# Patient Record
Sex: Female | Born: 1965 | Race: White | Hispanic: No | Marital: Married | Smoking: Never smoker
Health system: Southern US, Community
[De-identification: ages and names within clinical notes are randomized; demographics above are authoritative.]

## PROBLEM LIST (undated history)

## (undated) DIAGNOSIS — F419 Anxiety disorder, unspecified: Secondary | ICD-10-CM

## (undated) HISTORY — DX: Anxiety disorder, unspecified: F41.9

---

## 1999-10-29 ENCOUNTER — Encounter: Admission: RE | Admit: 1999-10-29 | Discharge: 1999-10-29 | Payer: Self-pay | Admitting: Family Medicine

## 1999-10-29 ENCOUNTER — Encounter: Payer: Self-pay | Admitting: Family Medicine

## 2000-12-16 ENCOUNTER — Encounter (INDEPENDENT_AMBULATORY_CARE_PROVIDER_SITE_OTHER): Payer: Self-pay

## 2000-12-16 ENCOUNTER — Inpatient Hospital Stay (HOSPITAL_COMMUNITY): Admission: AD | Admit: 2000-12-16 | Discharge: 2000-12-19 | Payer: Self-pay | Admitting: Obstetrics and Gynecology

## 2001-02-01 ENCOUNTER — Other Ambulatory Visit: Admission: RE | Admit: 2001-02-01 | Discharge: 2001-02-01 | Payer: Self-pay | Admitting: *Deleted

## 2002-04-25 ENCOUNTER — Inpatient Hospital Stay (HOSPITAL_COMMUNITY): Admission: AD | Admit: 2002-04-25 | Discharge: 2002-04-25 | Payer: Self-pay | Admitting: *Deleted

## 2002-04-25 ENCOUNTER — Inpatient Hospital Stay (HOSPITAL_COMMUNITY): Admission: AD | Admit: 2002-04-25 | Discharge: 2002-04-28 | Payer: Self-pay | Admitting: Obstetrics and Gynecology

## 2003-09-18 ENCOUNTER — Other Ambulatory Visit: Admission: RE | Admit: 2003-09-18 | Discharge: 2003-09-18 | Payer: Self-pay | Admitting: *Deleted

## 2005-01-21 ENCOUNTER — Other Ambulatory Visit: Admission: RE | Admit: 2005-01-21 | Discharge: 2005-01-21 | Payer: Self-pay | Admitting: Obstetrics and Gynecology

## 2006-03-25 ENCOUNTER — Other Ambulatory Visit: Admission: RE | Admit: 2006-03-25 | Discharge: 2006-03-25 | Payer: Self-pay | Admitting: Family Medicine

## 2007-07-27 ENCOUNTER — Encounter: Admission: RE | Admit: 2007-07-27 | Discharge: 2007-07-27 | Payer: Self-pay | Admitting: Family Medicine

## 2008-05-07 ENCOUNTER — Other Ambulatory Visit: Admission: RE | Admit: 2008-05-07 | Discharge: 2008-05-07 | Payer: Self-pay | Admitting: Family Medicine

## 2013-03-02 ENCOUNTER — Other Ambulatory Visit (HOSPITAL_COMMUNITY)
Admission: RE | Admit: 2013-03-02 | Discharge: 2013-03-02 | Disposition: A | Discharge status: Home / Self Care | Payer: Self-pay | Source: Ambulatory Visit | Attending: Family Medicine | Admitting: Family Medicine

## 2013-03-02 DIAGNOSIS — Z01419 Encounter for gynecological examination (general) (routine) without abnormal findings: Secondary | ICD-10-CM | POA: Insufficient documentation

## 2014-07-30 ENCOUNTER — Other Ambulatory Visit: Payer: Self-pay

## 2014-07-30 DIAGNOSIS — Z1239 Encounter for other screening for malignant neoplasm of breast: Secondary | ICD-10-CM

## 2014-08-05 ENCOUNTER — Other Ambulatory Visit: Payer: Self-pay | Admitting: Family Medicine

## 2014-08-05 DIAGNOSIS — R140 Abdominal distension (gaseous): Secondary | ICD-10-CM

## 2014-08-08 ENCOUNTER — Other Ambulatory Visit: Payer: Self-pay

## 2014-08-12 ENCOUNTER — Ambulatory Visit
Admission: RE | Admit: 2014-08-12 | Discharge: 2014-08-12 | Disposition: A | Discharge status: Home / Self Care | Payer: BC Managed Care – PPO | Source: Ambulatory Visit | Attending: Family Medicine | Admitting: Family Medicine

## 2014-08-12 DIAGNOSIS — R140 Abdominal distension (gaseous): Secondary | ICD-10-CM

## 2014-08-15 ENCOUNTER — Other Ambulatory Visit: Payer: Self-pay

## 2014-08-15 DIAGNOSIS — Z1231 Encounter for screening mammogram for malignant neoplasm of breast: Secondary | ICD-10-CM

## 2014-08-16 ENCOUNTER — Other Ambulatory Visit: Payer: Self-pay | Admitting: Family Medicine

## 2014-08-16 DIAGNOSIS — N83202 Unspecified ovarian cyst, left side: Principal | ICD-10-CM

## 2014-08-16 DIAGNOSIS — N83201 Unspecified ovarian cyst, right side: Secondary | ICD-10-CM

## 2014-08-20 ENCOUNTER — Ambulatory Visit
Admission: RE | Admit: 2014-08-20 | Discharge: 2014-08-20 | Disposition: A | Discharge status: Home / Self Care | Payer: BC Managed Care – PPO | Source: Ambulatory Visit

## 2014-08-20 DIAGNOSIS — Z1231 Encounter for screening mammogram for malignant neoplasm of breast: Secondary | ICD-10-CM

## 2014-09-30 ENCOUNTER — Ambulatory Visit
Admission: RE | Admit: 2014-09-30 | Discharge: 2014-09-30 | Disposition: A | Discharge status: Home / Self Care | Payer: BC Managed Care – PPO | Source: Ambulatory Visit | Attending: Family Medicine | Admitting: Family Medicine

## 2014-09-30 DIAGNOSIS — N83202 Unspecified ovarian cyst, left side: Principal | ICD-10-CM

## 2014-09-30 DIAGNOSIS — N83201 Unspecified ovarian cyst, right side: Secondary | ICD-10-CM

## 2014-12-12 IMAGING — MG MM SCREEN MAMMOGRAM BILATERAL
4 series · 4 of 4 positions shown · non-contrast
Comparison: Previous exam(s).

CLINICAL DATA: Screening.

EXAM:
DIGITAL SCREENING BILATERAL MAMMOGRAM WITH CAD

[R CC]
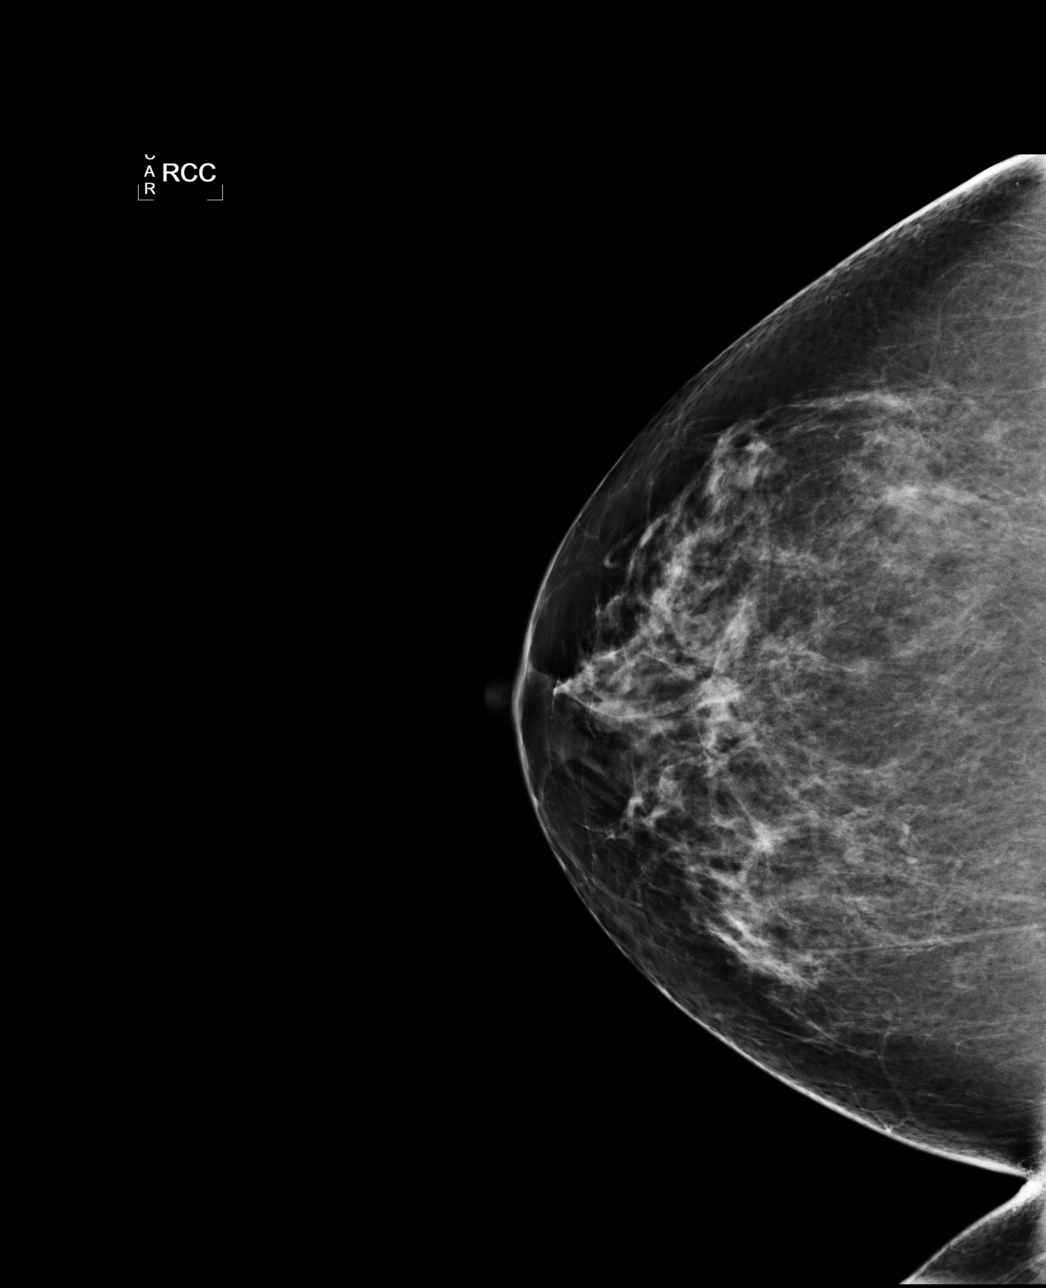

[L CC]
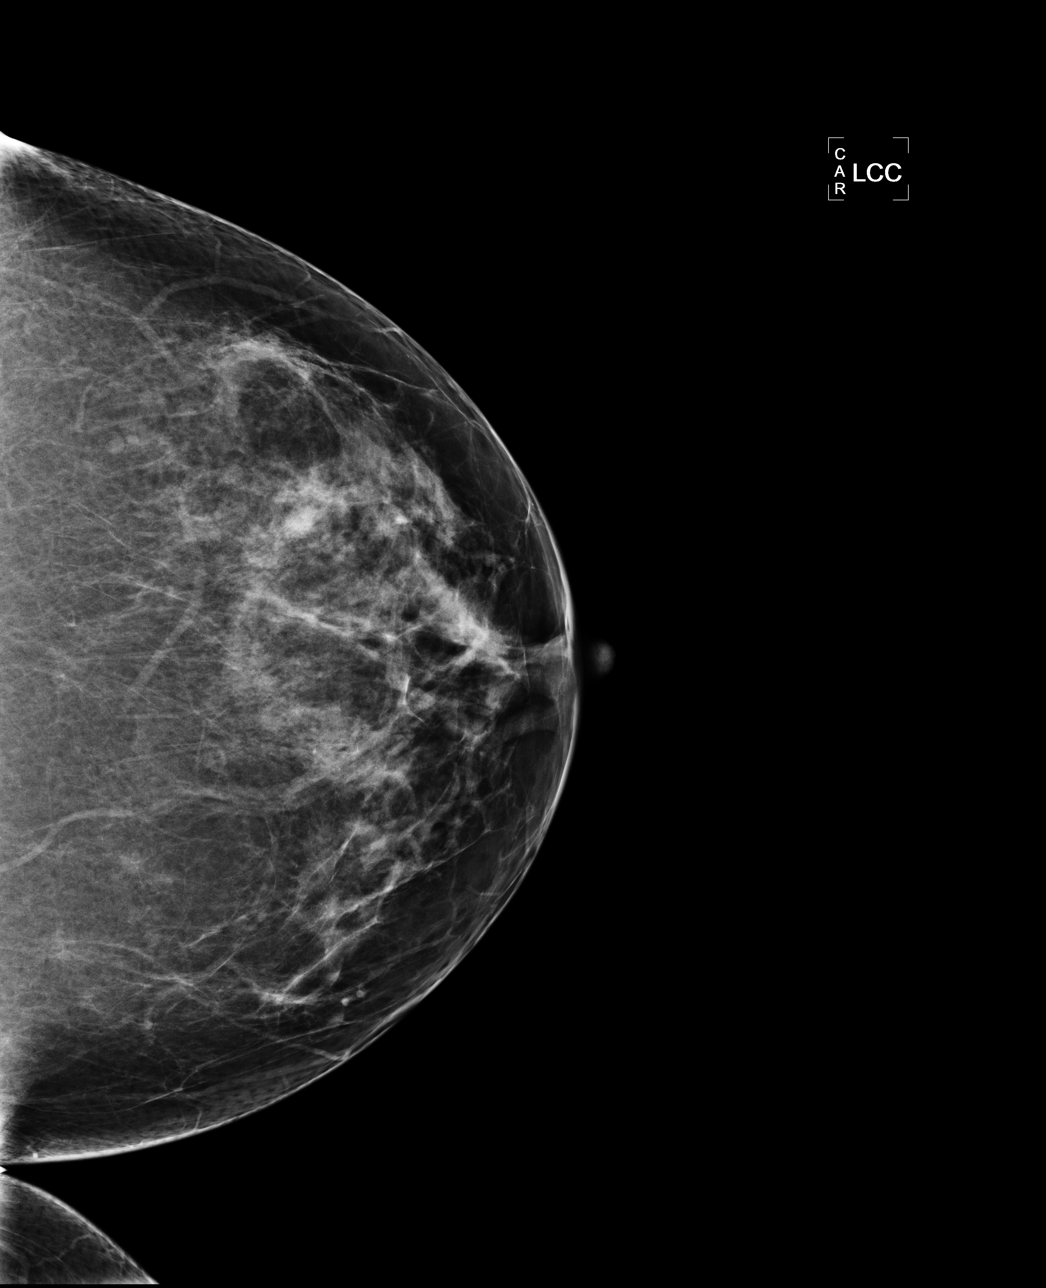

[L MLO]
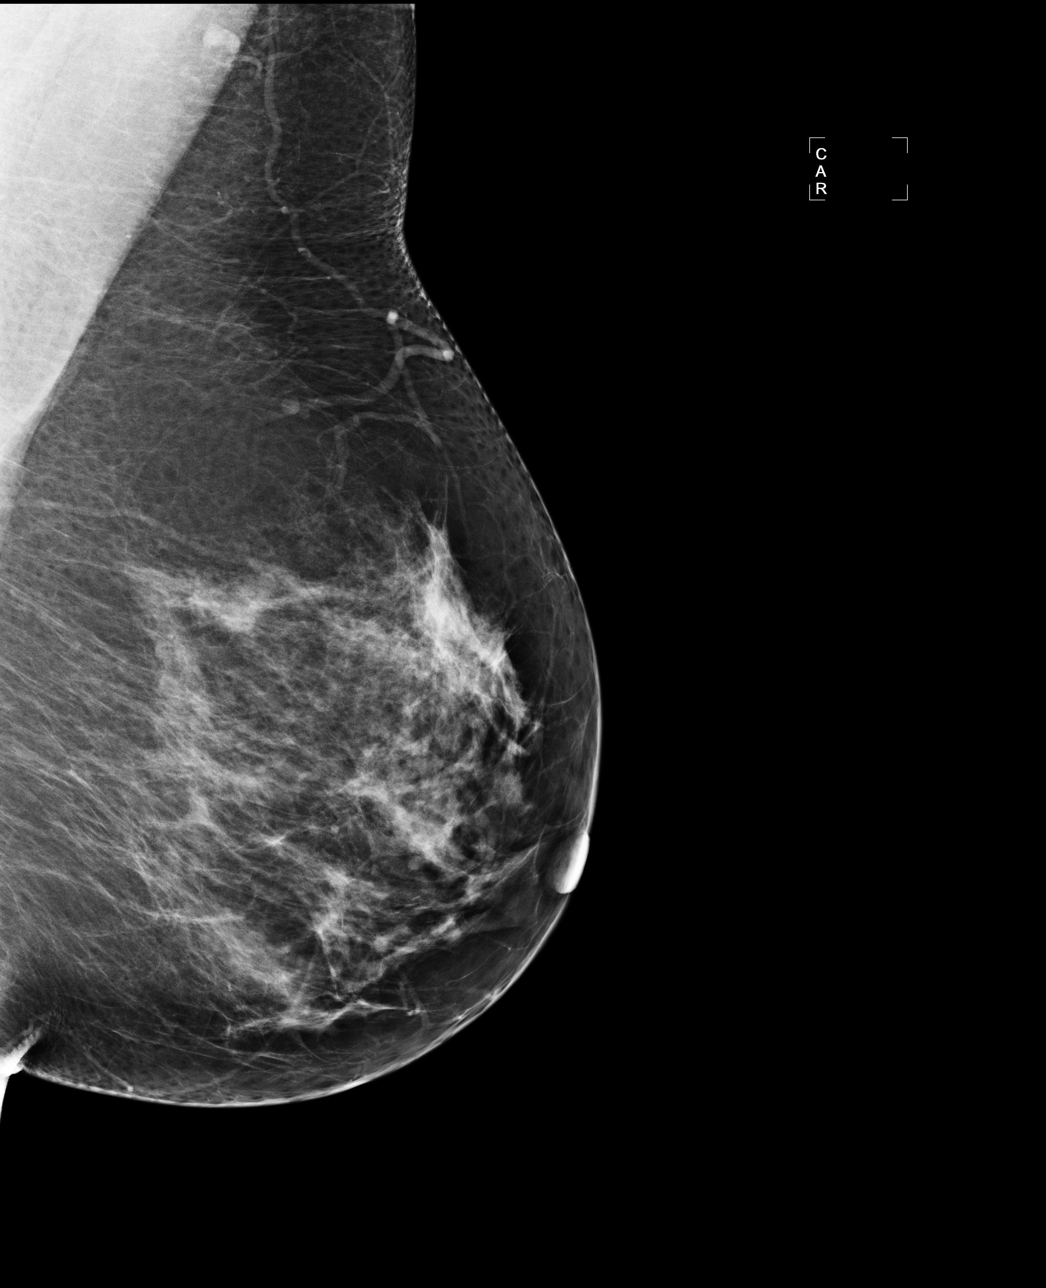

[R MLO]
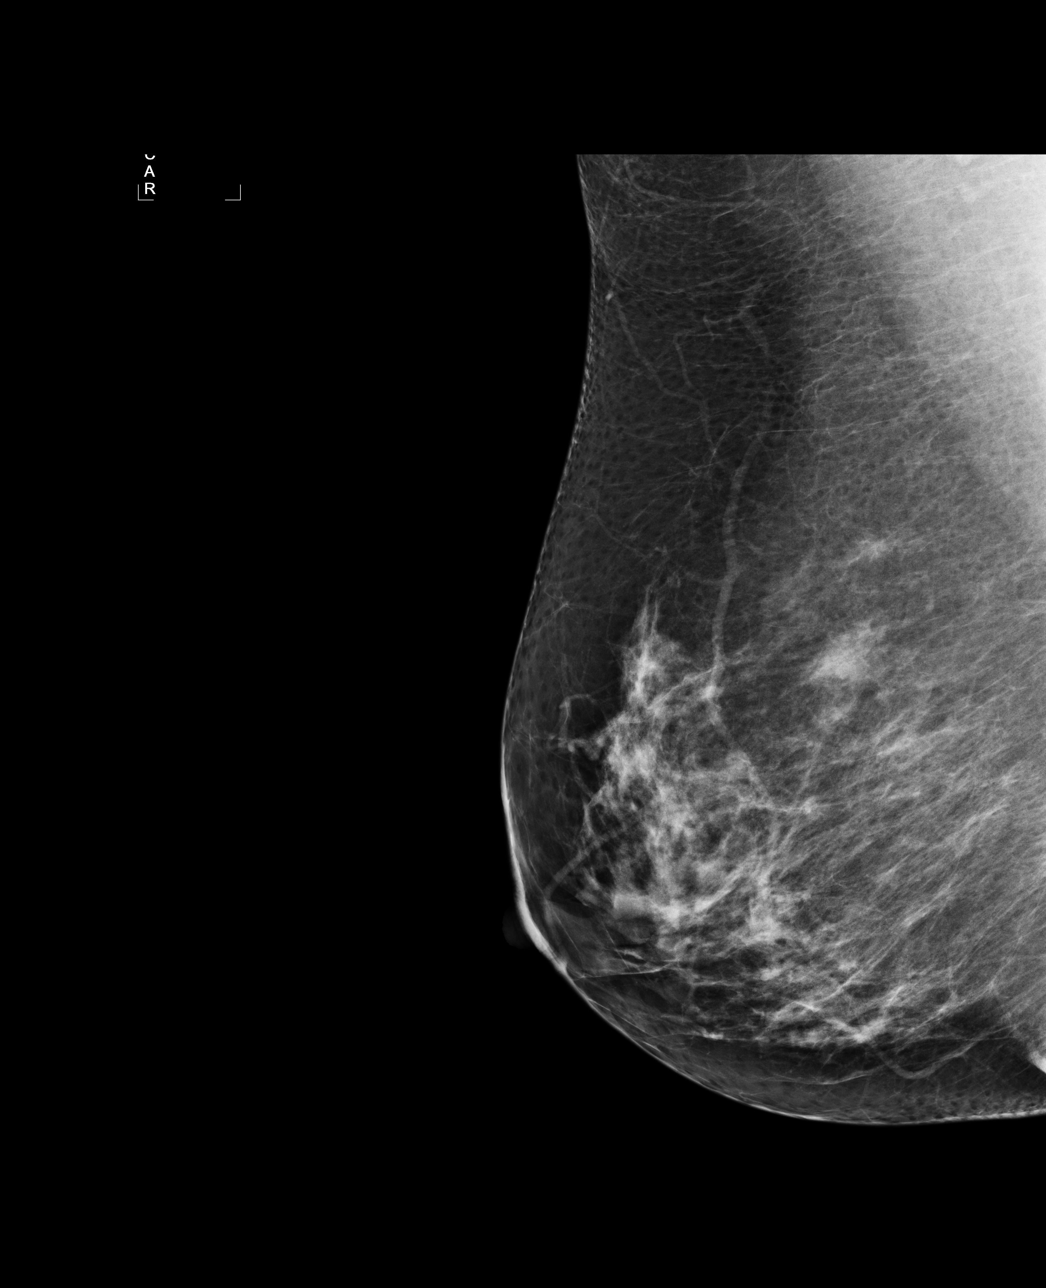

[4 of 4 positions shown; findings below may reference images not displayed]

ACR Breast Density Category c: The breast tissue is heterogeneously
dense, which may obscure small masses.
FINDINGS: There are no findings suspicious for malignancy. Images were
processed with CAD.
IMPRESSION: No mammographic evidence of malignancy. A result letter of this
screening mammogram will be mailed directly to the patient.

RECOMMENDATION:
Screening mammogram in one year. (Code:YJ-[id number])

BI-RADS CATEGORY  1: Negative.

## 2015-01-22 IMAGING — US US PELVIS COMPLETE
1 series · 13 of 25 positions shown · non-contrast
Comparison: 08/12/2014.

CLINICAL DATA: Bilateral ovarian cyst.

EXAM:
TRANSABDOMINAL AND TRANSVAGINAL ULTRASOUND OF PELVIS
TECHNIQUE: Both transabdominal and transvaginal ultrasound examinations of the
pelvis were performed. Transabdominal technique was performed for
global imaging of the pelvis including uterus, ovaries, adnexal
regions, and pelvic cul-de-sac. It was necessary to proceed with
endovaginal exam following the transabdominal exam to visualize the
uterus and ovaries..

[Series 1: us pelvis complete · 0.28mm/px · 80 acquisitions, 13 frames shown]
[im 1/80]
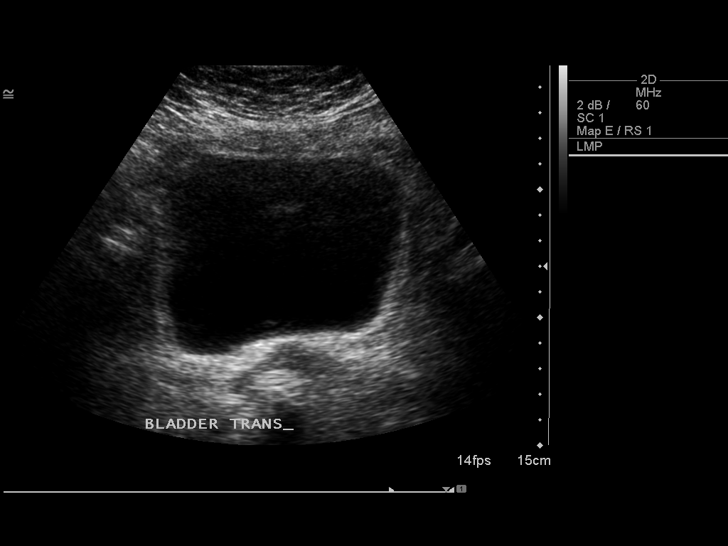
[im 7/80]
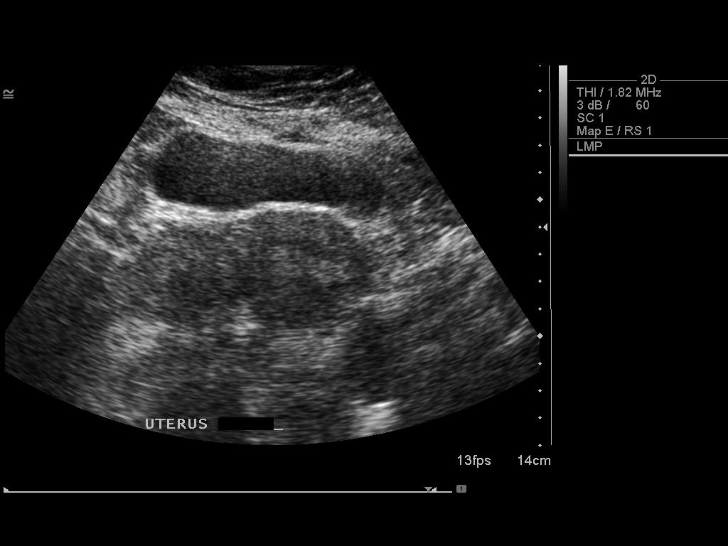
[im 14/80]
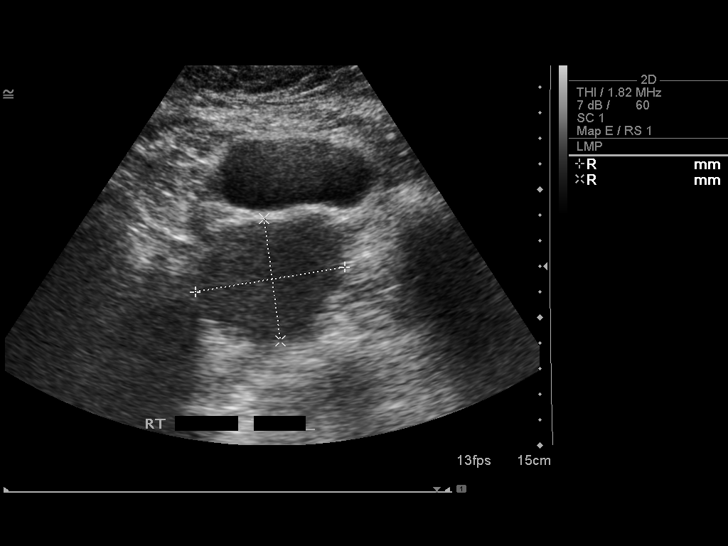
[im 20/80]
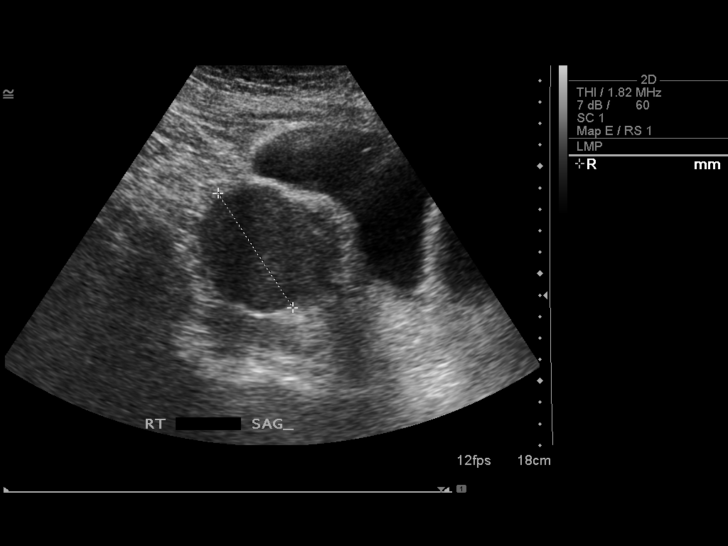
[im 27/80]
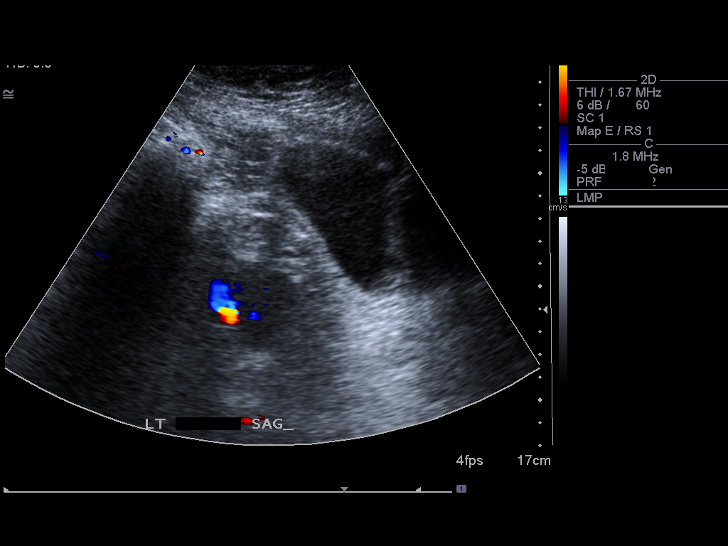
[im 33/80]
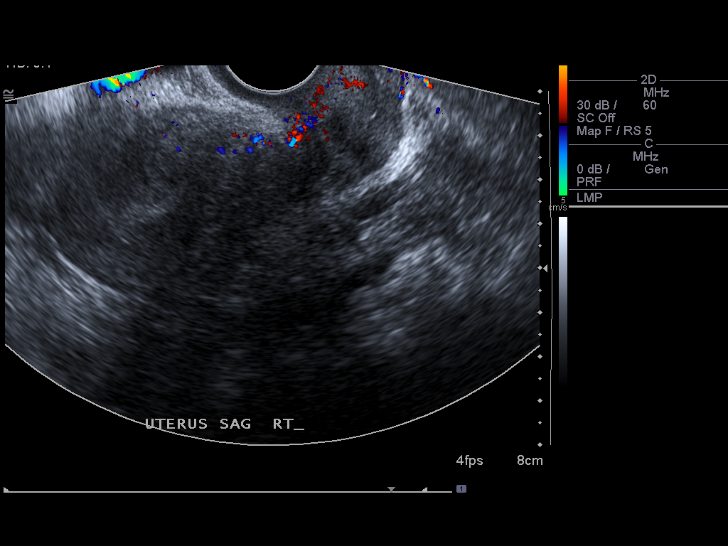
[im 40/80]
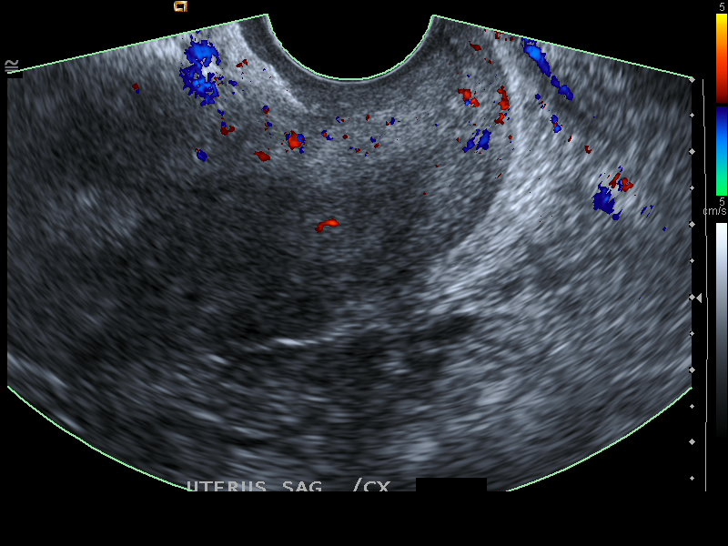
[im 47/80]
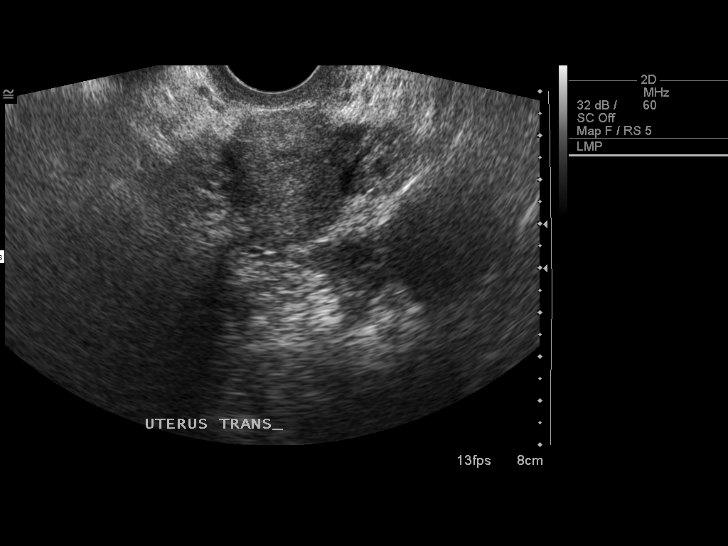
[im 53/80]
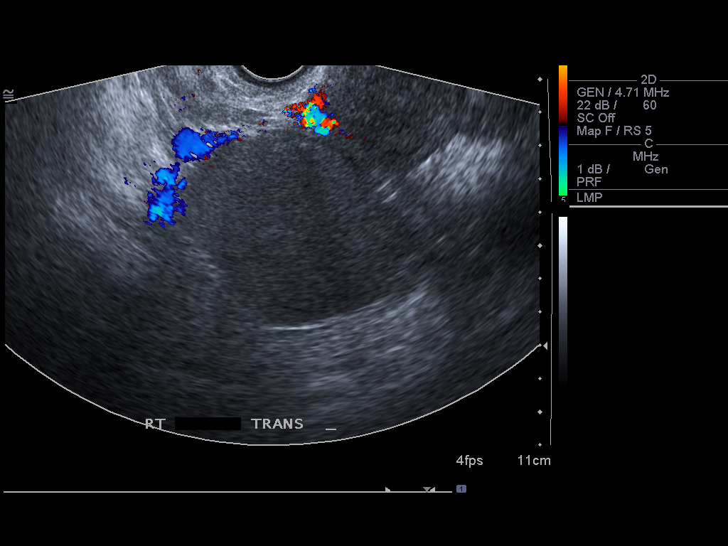
[im 60/80]
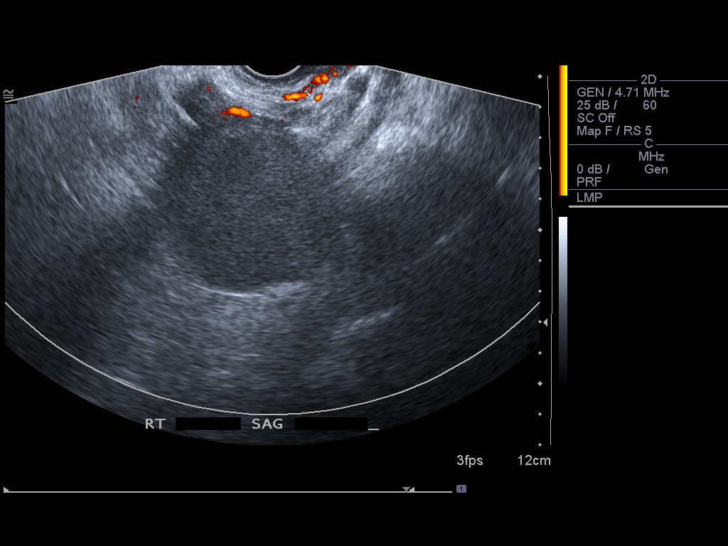
[im 66/80]
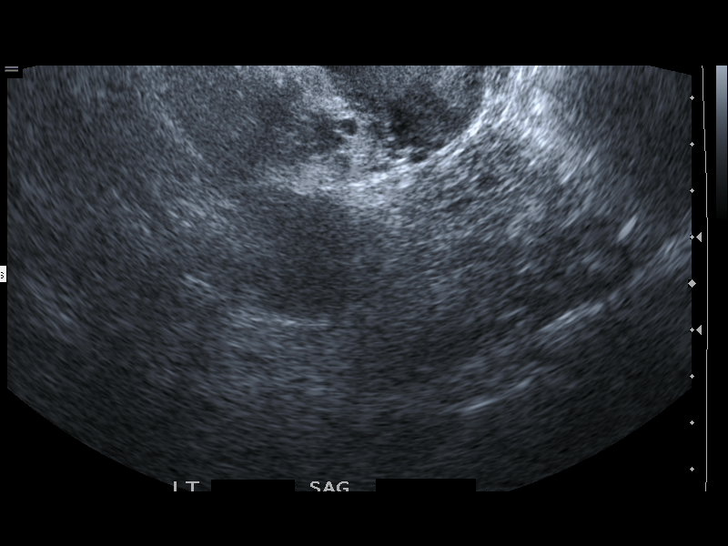
[im 73/80]
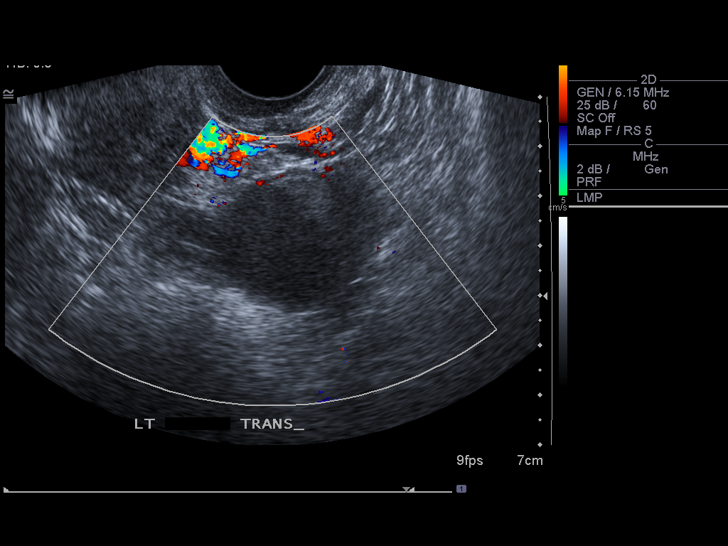
[im 80/80]
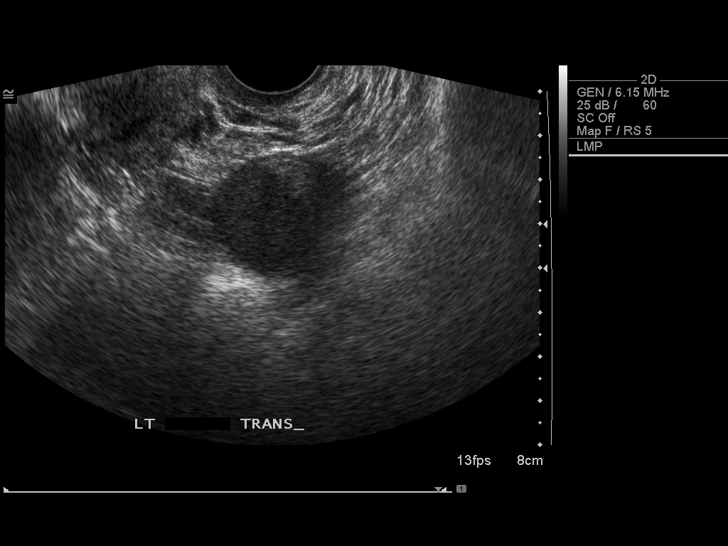

[13 of 25 positions shown; findings below may reference images not displayed]

FINDINGS: Uterus

Measurements: 8.5 x 4.4 x 5.1 cm. No fibroids or other mass
visualized.

Endometrium

Thickness: 9.5 mm.  No focal abnormality visualized.

Right ovary

Measurements: 5.3 x 6.8 x 5.2 cm. 4.4 x 6.1 x 4.6 cm hypoechoic
mass, this is not resolved from prior examination. Again this could
represent a hemorrhagic cyst, endometrioma, or dermoid. Ovarian
tumor less likely. This does not appear to be of infectious origin.

Left ovary

Measurements: 3.6 x 3.0 x 3.8 cm. 2.7 x 2.3 x 3.2 cm complex cyst,
most likely hemorrhagic cyst. This is diminished slightly in size
from prior exam further suggesting hemorrhagic cyst.

Other findings

No free fluid.
IMPRESSION: 1. Persistent hypoechoic mass in the right ovary. Again although
this could represent a hemorrhagic cyst, it has not demonstrated any
degree of resolution from 08/12/2014. Endometrioma and dermoid
should be considered. Ovarian tumor cannot be excluded. Gynecologic
consultation suggested for further evaluation.
2. Partial resolution of hypoechoic complex cyst left ovary, most
likely resolving hemorrhagic cyst.

## 2015-07-21 ENCOUNTER — Other Ambulatory Visit: Payer: Self-pay

## 2015-07-21 DIAGNOSIS — Z1231 Encounter for screening mammogram for malignant neoplasm of breast: Secondary | ICD-10-CM

## 2015-08-25 ENCOUNTER — Ambulatory Visit: Payer: Self-pay

## 2015-10-26 HISTORY — PX: OOPHORECTOMY: SHX86

## 2016-08-09 DIAGNOSIS — R0681 Apnea, not elsewhere classified: Secondary | ICD-10-CM | POA: Diagnosis not present

## 2016-08-09 DIAGNOSIS — F419 Anxiety disorder, unspecified: Secondary | ICD-10-CM | POA: Diagnosis not present

## 2016-08-09 DIAGNOSIS — Z23 Encounter for immunization: Secondary | ICD-10-CM | POA: Diagnosis not present

## 2016-08-09 DIAGNOSIS — E6609 Other obesity due to excess calories: Secondary | ICD-10-CM | POA: Diagnosis not present

## 2017-09-14 DIAGNOSIS — R05 Cough: Secondary | ICD-10-CM | POA: Diagnosis not present

## 2017-10-20 ENCOUNTER — Other Ambulatory Visit: Payer: Self-pay | Admitting: Family Medicine

## 2017-10-20 ENCOUNTER — Ambulatory Visit
Admission: RE | Admit: 2017-10-20 | Discharge: 2017-10-20 | Disposition: A | Discharge status: Home / Self Care | Payer: BLUE CROSS/BLUE SHIELD | Source: Ambulatory Visit | Attending: Family Medicine | Admitting: Family Medicine

## 2017-10-20 DIAGNOSIS — R053 Chronic cough: Secondary | ICD-10-CM

## 2017-10-20 DIAGNOSIS — R062 Wheezing: Secondary | ICD-10-CM | POA: Diagnosis not present

## 2017-10-20 DIAGNOSIS — R05 Cough: Secondary | ICD-10-CM

## 2017-11-10 ENCOUNTER — Encounter: Payer: Self-pay | Admitting: Allergy

## 2017-11-10 ENCOUNTER — Ambulatory Visit: Payer: BLUE CROSS/BLUE SHIELD | Admitting: Allergy

## 2017-11-10 VITALS — BP 136/90 | HR 100 | Temp 98.0°F | Resp 20 | Ht 67.5 in | Wt 244.6 lb

## 2017-11-10 DIAGNOSIS — J310 Chronic rhinitis: Secondary | ICD-10-CM

## 2017-11-10 DIAGNOSIS — K219 Gastro-esophageal reflux disease without esophagitis: Secondary | ICD-10-CM

## 2017-11-10 DIAGNOSIS — J4540 Moderate persistent asthma, uncomplicated: Secondary | ICD-10-CM

## 2017-11-10 MED ORDER — LEVOCETIRIZINE DIHYDROCHLORIDE 5 MG PO TABS
5.0000 mg | ORAL_TABLET | Freq: Every evening | ORAL | 5 refills | Status: DC
Start: 2017-11-10 — End: 2018-06-22

## 2017-11-10 MED ORDER — PROAIR HFA 108 (90 BASE) MCG/ACT IN AERS: 2.0000 | INHALATION_SPRAY | RESPIRATORY_TRACT | 1 refills | Status: DC | PRN

## 2017-11-10 MED ORDER — AZELASTINE-FLUTICASONE 137-50 MCG/ACT NA SUSP
1.0000 | Freq: Two times a day (BID) | NASAL | 5 refills | Status: DC
Start: 2017-11-10 — End: 2018-06-22

## 2017-11-10 MED ORDER — BUDESONIDE-FORMOTEROL FUMARATE 80-4.5 MCG/ACT IN AERO
2.0000 | INHALATION_SPRAY | Freq: Two times a day (BID) | RESPIRATORY_TRACT | 5 refills | Status: DC
Start: 2017-11-10 — End: 2018-06-22

## 2017-11-10 NOTE — Progress Notes (Signed)
New Patient Note  RE: [name] MRN: [id number] DOB: 1966/10/20 Date of Office Visit: 11/10/2017  Referring provider: Antony Contras, MD Primary care provider: Harlan Stains, MD  Chief Complaint: Persistent cough  History of present illness: Jamie Cain is a 52 y.o. female presenting today for consultation for persistent cough.  She states she had a cold in September2018 and has had a cough and wheezing since then.  She states the wheezing worse than the cough both of which have been peristsent.  She denies any productivity with the cough.  She also denies any significant shortness of breath or chest tightness.  She has seen her PCP for the symptoms.  She has been prescribed a cough syrup with codeine which she did not feel help much so she stopped using this.  She has tried Claritin which she did not feel helped that much either.  She also was provided with an albuterol inhaler which she states she has had temporary relief of her symptoms and is needing to use it almost every 6 hours.  She is also been tried on Dexilant that she took for about 15 days and she does feel that this helped with her reflux symptoms however her cough and wheezing still persisted.  She also feels like she throat clears throughout the day.  She does feel that her symptoms are worse after it has been rainy and damp.  She does state with the cold that started her current symptoms that she did not requiring any antibiotics or any other therapies besides supportive care.  Her symptoms are worse at night.  She denies any significant nasal ocular symptoms.  She does state that she normally does not breathe out of her nose and that her husband has told her that she sounds like she is "stopped up in the head"    She does report 3 years or so ago she did have some issues with her breathing however she also thought she could have been having panic attacks but she was prescribed an inhaler at that time but use of very  infrequent.  She also was started on antianxiety medication which helped her symptoms greatly thus she concluded that her symptoms then were related to panic attacks.      Review of systems: Review of Systems  Constitutional: Negative for chills, fever and malaise/fatigue.  HENT: Positive for congestion. Negative for ear discharge, ear pain, nosebleeds, sinus pain, sore throat and tinnitus.   Eyes: Negative for pain, discharge and redness.  Respiratory: Positive for cough and wheezing. Negative for hemoptysis, sputum production and shortness of breath.   Cardiovascular: Negative for chest pain.  Gastrointestinal: Positive for heartburn. Negative for abdominal pain, constipation, diarrhea, nausea and vomiting.  Musculoskeletal: Negative for joint pain and myalgias.  Skin: Negative for itching and rash.  Neurological: Negative for headaches.    All other systems negative unless noted above in HPI  Past medical history: Past Medical History:  Diagnosis Date  . Anxiety     Past surgical history: Past Surgical History:  Procedure Laterality Date  . OOPHORECTOMY Left 2017    Family history:  Family History  Problem Relation Age of Onset  . Allergic rhinitis Brother   . Asthma Brother     Social history: She lives in a home without carpeting with gas and electric heating and central and window cooling.  There is a cat in the home.  There is no concern for roaches but there is concern  for water damage or mildew.  She works as a Artist.  She denies a smoking history   Medication List: Allergies as of 11/10/2017      Reactions   Codeine Nausea Only   Upset stomach      Medication List        Accurate as of 11/10/17  6:52 PM. Always use your most recent med list.          Azelastine-Fluticasone 137-50 MCG/ACT Susp Commonly known as:  DYMISTA Place 1 spray into both nostrils 2 (two) times daily.   budesonide-formoterol 80-4.5 MCG/ACT  inhaler Commonly known as:  SYMBICORT Inhale 2 puffs into the lungs 2 (two) times daily.   escitalopram 10 MG tablet Commonly known as:  LEXAPRO   levocetirizine 5 MG tablet Commonly known as:  XYZAL Take 1 tablet (5 mg total) by mouth every evening.   loratadine 10 MG tablet Commonly known as:  CLARITIN Take 10 mg by mouth daily.   PROAIR HFA 108 (90 Base) MCG/ACT inhaler Generic drug:  albuterol Inhale 2 puffs into the lungs every 4 (four) hours as needed for wheezing or shortness of breath.       Known medication allergies: Allergies  Allergen Reactions  . Codeine Nausea Only    Upset stomach     Physical examination: Blood pressure 136/90, pulse 100, temperature 98 F (36.7 C), temperature source Oral, resp. rate 20, height 5' 7.5" (1.715 m), weight 244 lb 9.6 oz (110.9 kg), SpO2 98 %.  General: Alert, interactive, in no acute distress. HEENT: PERRLA, TMs pearly gray, turbinates edematous and pale with clear discharge, post-pharynx non erythematous. Neck: Supple without lymphadenopathy. Lungs: end expiratory wheezing throughout lung fields. {no increased work of breathing. CV: Normal S1, S2 without murmurs. Abdomen: Nondistended, nontender. Skin: Warm and dry, without lesions or rashes. Extremities:  No clubbing, cyanosis or edema. Neuro:   Grossly intact.  Diagnositics/Labs: Chest x-ray from 10/20/2017 personally reviewed and is unremarkable.  Impression: No active cardiopulmonary disease  Spirometry: FEV1: 2.11L  65%, FVC: 2.66L  65%, ratio consistent with Restrictive pattern.  This is patient's first attempt at spirometry ever.  ATS criteria was not met.  She also had albuterol about 30 minutes to an hour prior to this study.  Thus this is a post albuterol spirometry.  Allergy testing: Deferred due to reduced lung function.  Assessment and plan:   Asthma     - lung function today is decreased.  Your symptoms are consistent with adult-onset asthma triggered  by upper respiratory tract infection.  You have improvement in symptoms with albuterol use which is also consistent with asthma      - at this time start Symbicort 63mg 2 puffs twice a day     - have access to albuterol inhaler 2 puffs every 4-6 hours as needed for cough/wheeze/shortness of breath/chest tightness.  May use 15-20 minutes prior to activity.   Monitor frequency of use.    Asthma control goals:   Full participation in all desired activities (may need albuterol before activity)  Albuterol use two time or less a week on average (not counting use with activity)  Cough interfering with sleep two time or less a month  Oral steroids no more than once a year  No hospitalizations  Chronic rhinitis    - will obtain environmental allergy profile    - recommend starting dymista 1 spray each nostril twice a day.  This is a combination nasal spray with Flonase +  Astelin (nasal antihistamine).  This helps with both nasal congestion and drainage.     - continue of your Claritin until gone then try Xyzal 40m   Reflux    - continue Dexilant 319monce a day prior to meal     - recommend treating reflux to help decrease chance of cough and throat irritation.   Follow-up 2-3 months or sooner if needed     I appreciate the opportunity to take part in Anays's care. Please do not hesitate to contact me with questions.  Sincerely,   ShPrudy FeelerMD Allergy/Immunology [facility]

## 2017-11-10 NOTE — Patient Instructions (Signed)
Asthma     - lung function today is decreased.  Your symptoms are consistent with adult-onset asthma triggered by upper respiratory tract infection.  You have improvement in symptoms with albuterol use which is also consistent with asthma      - at this time start Symbicort 80mcg 2 puffs twice a day     - have access to albuterol inhaler 2 puffs every 4-6 hours as needed for cough/wheeze/shortness of breath/chest tightness.  May use 15-20 minutes prior to activity.   Monitor frequency of use.    Asthma control goals:   Full participation in all desired activities (may need albuterol before activity)  Albuterol use two time or less a week on average (not counting use with activity)  Cough interfering with sleep two time or less a month  Oral steroids no more than once a year  No hospitalizations  Nasal congestion and drainage    - will obtain environmental allergy profile    - recommend starting dymista 1 spray each nostril twice a day.  This is a combination nasal spray with Flonase + Astelin (nasal antihistamine).  This helps with both nasal congestion and drainage.     - continue of your Claritin until gone then try Xyzal 5mg    Reflux    - continue Dexilant 30mg  once a day prior to meal     - recommend treating reflux to help decrease chance of cough and throat irritation.   Follow-up 2-3 months or sooner if needed

## 2017-11-13 LAB — ALLERGENS W/TOTAL IGE AREA 2
Aspergillus Fumigatus IgE: <0.1 kU/L
Bermuda Grass IgE: <0.1 kU/L
Cat Dander IgE: <0.1 kU/L
Cedar, Mountain IgE: <0.1 kU/L
Cockroach, German IgE: <0.1 kU/L
Common Silver Birch IgE: <0.1 kU/L
Cottonwood IgE: <0.1 kU/L
D Farinae IgE: <0.1 kU/L
D Pteronyssinus IgE: <0.1 kU/L
Dog Dander IgE: <0.1 kU/L
IgE (Immunoglobulin E), Serum: 8 IU/mL (ref 0–100)
Johnson Grass IgE: <0.1 kU/L
Mouse Urine IgE: <0.1 kU/L
Oak, White IgE: <0.1 kU/L

## 2017-12-01 DIAGNOSIS — F419 Anxiety disorder, unspecified: Secondary | ICD-10-CM | POA: Diagnosis not present

## 2017-12-09 DIAGNOSIS — Z1231 Encounter for screening mammogram for malignant neoplasm of breast: Secondary | ICD-10-CM | POA: Diagnosis not present

## 2017-12-09 DIAGNOSIS — Z01419 Encounter for gynecological examination (general) (routine) without abnormal findings: Secondary | ICD-10-CM | POA: Diagnosis not present

## 2017-12-09 DIAGNOSIS — Z6838 Body mass index (BMI) 38.0-38.9, adult: Secondary | ICD-10-CM | POA: Diagnosis not present

## 2017-12-12 ENCOUNTER — Ambulatory Visit: Payer: Self-pay | Admitting: Allergy and Immunology

## 2018-02-09 ENCOUNTER — Ambulatory Visit: Payer: BLUE CROSS/BLUE SHIELD | Admitting: Allergy

## 2018-02-09 ENCOUNTER — Encounter: Payer: Self-pay | Admitting: Allergy

## 2018-02-09 VITALS — BP 128/78 | HR 96 | Resp 19

## 2018-02-09 DIAGNOSIS — K219 Gastro-esophageal reflux disease without esophagitis: Secondary | ICD-10-CM

## 2018-02-09 DIAGNOSIS — J4540 Moderate persistent asthma, uncomplicated: Secondary | ICD-10-CM | POA: Diagnosis not present

## 2018-02-09 DIAGNOSIS — J310 Chronic rhinitis: Secondary | ICD-10-CM | POA: Diagnosis not present

## 2018-02-09 NOTE — Patient Instructions (Addendum)
Asthma     - lung function today is good today. You have improvement in symptoms with use of Symbicort and as needed albuterol      - Symbicort 80mcg 2 puffs twice a day     - have access to albuterol inhaler 2 puffs every 4-6 hours as needed for cough/wheeze/shortness of breath/chest tightness.  May use 15-20 minutes prior to activity.   Monitor frequency of use.    Asthma control goals:   Full participation in all desired activities (may need albuterol before activity)  Albuterol use two time or less a week on average (not counting use with activity)  Cough interfering with sleep two time or less a month  Oral steroids no more than once a year  No hospitalizations  Nasal congestion and drainage    - dymista 1 spray each nostril twice a day.  This is a combination nasal spray with Flonase + Astelin (nasal antihistamine).  This helps with both nasal congestion and drainage.   Will separate these medications for insurance coverage    - continue Xyzal 5mg      - recommend nasal saline rinse as needed.  Helpful to use during colds/respiratory illnesses and during pollen season.   Reflux    - continue Dexilant 30mg  once a day prior to meal     - recommend treating reflux to help decrease chance of cough and throat irritation.   Follow-up 3-6 months or sooner if needed

## 2018-02-09 NOTE — Progress Notes (Signed)
Follow-up Note  RE: Jamie Cain MRN: [id number] DOB: 1966-03-31 Date of Office Visit: 02/09/2018   History of present illness: Jamie Cain is a 52 y.o. female presenting today for follow-up of asthma, chronic rhinitis and reflux.  She was last seen in the office on 11/10/17 by myself.  After that visit she started on symbicort 80 2 puffs twice a dayas well as dymista nasal spray and xyzal.  She states these medications greatly help with her symptoms and she did so well that she stopped her medications since she was feeling better.  However about 2 weeks ago she developed symptoms of scratchy throat, runny nose primarily with some nasal congestion, headache and chest pressure.  Denies any fevers.  She states she is better from these symptoms with only remaining symptoms of runny nose.  She did start using symbicort 2 weeks ago with the symptoms as well as xyzal and dymista.    She denies any ED/UC visit or oral steroids for any asthma flares.  Denies any nighttime awakenings.  She had not needed to use her albuterol outside of the past 2 weeks.   She also has dexilant for reflux control.   She denies any major health changes, surgeries or hospitalizations since last visit.    Review of systems: Review of Systems  Constitutional: Negative for fever, malaise/fatigue and weight loss.  HENT: Positive for congestion and sore throat. Negative for ear discharge, ear pain, nosebleeds and sinus pain.   Eyes: Negative for pain, discharge and redness.  Respiratory: Negative for cough, sputum production, shortness of breath and wheezing.   Cardiovascular: Negative for chest pain.  Gastrointestinal: Negative for abdominal pain, constipation, diarrhea, nausea and vomiting.  Musculoskeletal: Negative for joint pain.  Skin: Negative for itching and rash.  Neurological: Positive for headaches.    All other systems negative unless noted above in HPI  Past medical/social/surgical/family history  have been reviewed and are unchanged unless specifically indicated below.  No changes  Medication List: Allergies as of 02/09/2018      Reactions   Codeine Nausea Only   Upset stomach      Medication List        Accurate as of 02/09/18  5:05 PM. Always use your most recent med list.          Azelastine-Fluticasone 137-50 MCG/ACT Susp Commonly known as:  DYMISTA Place 1 spray into both nostrils 2 (two) times daily.   budesonide-formoterol 80-4.5 MCG/ACT inhaler Commonly known as:  SYMBICORT Inhale 2 puffs into the lungs 2 (two) times daily.   buPROPion 150 MG 24 hr tablet Commonly known as:  WELLBUTRIN XL   escitalopram 10 MG tablet Commonly known as:  LEXAPRO   levocetirizine 5 MG tablet Commonly known as:  XYZAL Take 1 tablet (5 mg total) by mouth every evening.   PROAIR HFA 108 (90 Base) MCG/ACT inhaler Generic drug:  albuterol Inhale 2 puffs into the lungs every 4 (four) hours as needed for wheezing or shortness of breath.       Known medication allergies: Allergies  Allergen Reactions  . Codeine Nausea Only    Upset stomach     Physical examination: Blood pressure 128/78, pulse 96, resp. rate 19, SpO2 96 %.  General: Alert, interactive, in no acute distress. HEENT: PERRLA, TMs pearly gray, turbinates moderately edematous with clear discharge, post-pharynx non erythematous. Neck: Supple without lymphadenopathy. Lungs: Clear to auscultation without wheezing, rhonchi or rales. {no increased work of breathing. CV: Normal  S1, S2 without murmurs. Abdomen: Nondistended, nontender. Skin: Warm and dry, without lesions or rashes. Extremities:  No clubbing, cyanosis or edema. Neuro:   Grossly intact.  Diagnositics/Labs:  Spirometry: FEV1: 2.36L  75%, FVC: 2.83L 71%, ratio consistent with mild restrictive pattern   Assessment and plan:   Asthma, moderate persistent     - lung function today is good today. You have improvement in symptoms with use of  Symbicort and as needed albuterol      - Symbicort 80mcg 2 puffs twice a day     - have access to albuterol inhaler 2 puffs every 4-6 hours as needed for cough/wheeze/shortness of breath/chest tightness.  May use 15-20 minutes prior to activity.   Monitor frequency of use.    Asthma control goals:   Full participation in all desired activities (may need albuterol before activity)  Albuterol use two time or less a week on average (not counting use with activity)  Cough interfering with sleep two time or less a month  Oral steroids no more than once a year  No hospitalizations  Chronic rhinitis    - dymista 1 spray each nostril twice a day.  This is a combination nasal spray with Flonase + Astelin (nasal antihistamine).  This helps with both nasal congestion and drainage.   Will separate these medications for insurance coverage    - continue Xyzal 5mg      - recommend nasal saline rinse as needed.  Helpful to use during colds/respiratory illnesses and during pollen season.   Reflux    - continue Dexilant 30mg  once a day prior to meal     - recommend treating reflux to help decrease chance of cough and throat irritation.   Follow-up 3-6 months or sooner if needed   I appreciate the opportunity to take part in Jamie Cain's care. Please do not hesitate to contact me with questions.  Sincerely,   Margo AyeShaylar Shahd Occhipinti, MD Allergy/Immunology [facility]

## 2018-03-07 DIAGNOSIS — F419 Anxiety disorder, unspecified: Secondary | ICD-10-CM | POA: Diagnosis not present

## 2018-04-04 DIAGNOSIS — Z0000 Encounter for general adult medical examination without abnormal findings: Secondary | ICD-10-CM | POA: Diagnosis not present

## 2018-04-11 DIAGNOSIS — Z1211 Encounter for screening for malignant neoplasm of colon: Secondary | ICD-10-CM | POA: Diagnosis not present

## 2018-05-12 ENCOUNTER — Ambulatory Visit: Payer: BLUE CROSS/BLUE SHIELD | Admitting: Allergy

## 2018-05-15 ENCOUNTER — Ambulatory Visit: Payer: BLUE CROSS/BLUE SHIELD | Admitting: Allergy

## 2018-05-15 DIAGNOSIS — J309 Allergic rhinitis, unspecified: Secondary | ICD-10-CM

## 2018-05-18 DIAGNOSIS — G4733 Obstructive sleep apnea (adult) (pediatric): Secondary | ICD-10-CM | POA: Diagnosis not present

## 2018-05-30 DIAGNOSIS — G4733 Obstructive sleep apnea (adult) (pediatric): Secondary | ICD-10-CM | POA: Diagnosis not present

## 2018-06-22 ENCOUNTER — Ambulatory Visit: Payer: BLUE CROSS/BLUE SHIELD | Admitting: Allergy

## 2018-06-22 ENCOUNTER — Encounter: Payer: Self-pay | Admitting: Allergy

## 2018-06-22 ENCOUNTER — Encounter (INDEPENDENT_AMBULATORY_CARE_PROVIDER_SITE_OTHER): Payer: Self-pay

## 2018-06-22 VITALS — BP 118/68 | HR 98 | Resp 20

## 2018-06-22 DIAGNOSIS — K219 Gastro-esophageal reflux disease without esophagitis: Secondary | ICD-10-CM | POA: Diagnosis not present

## 2018-06-22 DIAGNOSIS — J310 Chronic rhinitis: Secondary | ICD-10-CM | POA: Diagnosis not present

## 2018-06-22 DIAGNOSIS — J4540 Moderate persistent asthma, uncomplicated: Secondary | ICD-10-CM

## 2018-06-22 MED ORDER — LEVOCETIRIZINE DIHYDROCHLORIDE 5 MG PO TABS: 5.0000 mg | ORAL_TABLET | Freq: Every evening | ORAL | 5 refills | Status: DC

## 2018-06-22 MED ORDER — PROAIR HFA 108 (90 BASE) MCG/ACT IN AERS: 2.0000 | INHALATION_SPRAY | RESPIRATORY_TRACT | 1 refills | Status: AC | PRN

## 2018-06-22 MED ORDER — AZELASTINE-FLUTICASONE 137-50 MCG/ACT NA SUSP: 1.0000 | Freq: Two times a day (BID) | NASAL | 5 refills | Status: AC

## 2018-06-22 MED ORDER — BUDESONIDE-FORMOTEROL FUMARATE 80-4.5 MCG/ACT IN AERO: 2.0000 | INHALATION_SPRAY | Freq: Two times a day (BID) | RESPIRATORY_TRACT | 5 refills | Status: AC

## 2018-06-22 NOTE — Progress Notes (Signed)
Follow-up Note  RE: Jamie Cain Hyneman MRN: [id number] DOB: 11/15/1965 Date of Office Visit: 06/22/2018   History of present illness: Jamie Cain Selmon is a 52 y.o. female presenting today for follow-up of asthma, chronic rhinitis and reflux.  She was last seen in the office on February 09, 2018 by myself.  She denies any major health changes, surgeries or hospitalizations since her last visit.  She did however get started on use of CPAP machine since her last visit and has been using this nightly. In regards to her asthma she states she has been doing very well and is only requiring use of her albuterol about twice a month on average.  She has low-dose Symbicort however she states she is not using it daily at this time as she is not having any symptoms.  She states she will use it when she has symptoms that become more persistent however the albuterol usually relieves her symptoms rather quickly without need to use her Symbicort.  She denies any nighttime awakenings.  Denies any ED or urgent care visits or need for oral steroids. She states she is not having any issues with nasal symptoms at this time.  She is taking Xyzal daily.  She does feel that the Dymista was helpful when she had the sample but she has run out. She also states that her reflux has been a little bit more symptomatic since she has not been on Dexilant.  She states she ran out of this medication to has not been able to refill this medication.  Review of systems: Review of Systems  Constitutional: Negative for chills, fever and malaise/fatigue.  HENT: Negative for congestion, ear discharge, nosebleeds and sore throat.   Eyes: Negative for pain, discharge and redness.  Respiratory: Negative for cough, shortness of breath and wheezing.   Cardiovascular: Negative for chest pain.  Gastrointestinal: Negative for abdominal pain, constipation, diarrhea, nausea and vomiting.  Musculoskeletal: Negative for joint pain.  Skin: Negative  for itching and rash.  Neurological: Negative for headaches.    All other systems negative unless noted above in HPI  Past medical/social/surgical/family history have been reviewed and are unchanged unless specifically indicated below.  No changes  Medication List: Allergies as of 06/22/2018      Reactions   Codeine Nausea Only   Upset stomach      Medication List        Accurate as of 06/22/18  7:51 PM. Always use your most recent med list.          Azelastine-Fluticasone 137-50 MCG/ACT Susp Place 1 spray into both nostrils 2 (two) times daily.   budesonide-formoterol 80-4.5 MCG/ACT inhaler Commonly known as:  SYMBICORT Inhale 2 puffs into the lungs 2 (two) times daily.   buPROPion 150 MG 24 hr tablet Commonly known as:  WELLBUTRIN XL Take 150 mg by mouth daily.   escitalopram 10 MG tablet Commonly known as:  LEXAPRO   levocetirizine 5 MG tablet Commonly known as:  XYZAL Take 1 tablet (5 mg total) by mouth every evening.   PROAIR HFA 108 (90 Base) MCG/ACT inhaler Generic drug:  albuterol Inhale 2 puffs into the lungs every 4 (four) hours as needed for wheezing or shortness of breath.       Known medication allergies: Allergies  Allergen Reactions  . Codeine Nausea Only    Upset stomach     Physical examination: Blood pressure 118/68, pulse 98, resp. rate 20, SpO2 99 %.  General: Alert, interactive,  in no acute distress. HEENT: PERRLA, TMs pearly gray, turbinates moderately edematous R>L without discharge, post-pharynx non erythematous. Neck: Supple without lymphadenopathy. Lungs: Clear to auscultation without wheezing, rhonchi or rales. {no increased work of breathing. CV: Normal S1, S2 without murmurs. Abdomen: Nondistended, nontender. Skin: Warm and dry, without lesions or rashes. Extremities:  No clubbing, cyanosis or edema. Neuro:   Grossly intact.  Diagnositics/Labs:  Spirometry: FEV1: 2.03L 63%, FVC: 2.47L 61% function is reduced for  age  Assessment and plan:   Asthma, moderate persistent  - improved symptoms     - Symbicort 2 puffs twice a day during symptoms (cough/wheeze/shortness of breath/chest tightness). If frequent use then resume twice a day maintenance dosing.       - have access to albuterol inhaler 2 puffs every 4-6 hours as needed for cough/wheeze/shortness of breath/chest tightness.  May use 15-20 minutes prior to activity.   Monitor frequency of use.    Asthma control goals:   Full participation in all desired activities (may need albuterol before activity)  Albuterol use two time or less a week on average (not counting use with activity)  Cough interfering with sleep two time or less a month  Oral steroids no more than once a year  No hospitalizations  Chronic rhinitis    - dymista 1 spray each nostril twice a day.  This is a combination nasal spray with Flonase + Astelin (nasal antihistamine).  This helps with both nasal congestion and drainage.  Provided with sample today    - continue Xyzal 5mg      - recommend nasal saline rinse as needed.  Helpful to use during colds/respiratory illnesses and during pollen season.   GERD    - continue Dexilant 30mg  once a day prior to meal     - recommend treating reflux to help decrease chance of cough and throat irritation.   Follow-up 6 months or sooner if needed   I appreciate the opportunity to take part in Alysiana'Cain care. Please do not hesitate to contact me with questions.  Sincerely,   Margo Aye, MD Allergy/Immunology [facility]

## 2018-06-22 NOTE — Patient Instructions (Signed)
Asthma  - improved symptoms     - Symbicort 80mcg 2 puffs twice a day during symptoms (cough/wheeze/shortness of breath/chest tightness). If frequent use then resume twice a day maintenance dosing.       - have access to albuterol inhaler 2 puffs every 4-6 hours as needed for cough/wheeze/shortness of breath/chest tightness.  May use 15-20 minutes prior to activity.   Monitor frequency of use.    Asthma control goals:   Full participation in all desired activities (may need albuterol before activity)  Albuterol use two time or less a week on average (not counting use with activity)  Cough interfering with sleep two time or less a month  Oral steroids no more than once a year  No hospitalizations  Nasal congestion and drainage    - dymista 1 spray each nostril twice a day.  This is a combination nasal spray with Flonase + Astelin (nasal antihistamine).  This helps with both nasal congestion and drainage.  Provided with sample today    - continue Xyzal 5mg      - recommend nasal saline rinse as needed.  Helpful to use during colds/respiratory illnesses and during pollen season.   Reflux    - continue Dexilant 30mg  once a day prior to meal     - recommend treating reflux to help decrease chance of cough and throat irritation.   Follow-up 6 months or sooner if needed

## 2018-06-30 DIAGNOSIS — G4733 Obstructive sleep apnea (adult) (pediatric): Secondary | ICD-10-CM | POA: Diagnosis not present

## 2018-07-06 DIAGNOSIS — E7800 Pure hypercholesterolemia, unspecified: Secondary | ICD-10-CM | POA: Diagnosis not present

## 2018-07-06 DIAGNOSIS — F411 Generalized anxiety disorder: Secondary | ICD-10-CM | POA: Diagnosis not present

## 2018-07-06 DIAGNOSIS — R946 Abnormal results of thyroid function studies: Secondary | ICD-10-CM | POA: Diagnosis not present

## 2018-07-30 DIAGNOSIS — G4733 Obstructive sleep apnea (adult) (pediatric): Secondary | ICD-10-CM | POA: Diagnosis not present

## 2018-08-14 DIAGNOSIS — G4733 Obstructive sleep apnea (adult) (pediatric): Secondary | ICD-10-CM | POA: Diagnosis not present

## 2018-08-30 DIAGNOSIS — G4733 Obstructive sleep apnea (adult) (pediatric): Secondary | ICD-10-CM | POA: Diagnosis not present

## 2018-09-29 DIAGNOSIS — G4733 Obstructive sleep apnea (adult) (pediatric): Secondary | ICD-10-CM | POA: Diagnosis not present

## 2018-12-05 DIAGNOSIS — G4733 Obstructive sleep apnea (adult) (pediatric): Secondary | ICD-10-CM | POA: Diagnosis not present

## 2018-12-29 DIAGNOSIS — G4733 Obstructive sleep apnea (adult) (pediatric): Secondary | ICD-10-CM | POA: Diagnosis not present

## 2019-01-29 DIAGNOSIS — G4733 Obstructive sleep apnea (adult) (pediatric): Secondary | ICD-10-CM | POA: Diagnosis not present

## 2019-02-28 ENCOUNTER — Telehealth: Payer: Self-pay

## 2019-02-28 DIAGNOSIS — J310 Chronic rhinitis: Secondary | ICD-10-CM

## 2019-02-28 DIAGNOSIS — G4733 Obstructive sleep apnea (adult) (pediatric): Secondary | ICD-10-CM | POA: Diagnosis not present

## 2019-02-28 MED ORDER — LEVOCETIRIZINE DIHYDROCHLORIDE 5 MG PO TABS
5.0000 mg | ORAL_TABLET | Freq: Every evening | ORAL | 5 refills | Status: AC
Start: 2019-02-28 — End: ?

## 2019-02-28 NOTE — Telephone Encounter (Signed)
Refill given for levocetirizine

## 2020-01-17 ENCOUNTER — Ambulatory Visit: Payer: Self-pay | Attending: Internal Medicine

## 2020-07-17 ENCOUNTER — Ambulatory Visit: Payer: BLUE CROSS/BLUE SHIELD | Admitting: Family Medicine

## 2020-07-17 NOTE — Progress Notes (Deleted)
   [address] [place] [address] Dept: [id number]  FOLLOW UP NOTE  Patient ID: Jamie Cain, female    DOB: Jun 13, 1966  Age: 54 y.o. MRN: [id number] Date of Office Visit: 07/17/2020  Assessment  Chief Complaint: No chief complaint on file.  HPI Jamie Cain is a 54 year old female who presents to the clinic for a follow up visit. She was last seen in this clinic on 06/22/2018 by Dr. Delorse Lek for evaluation of asthma, chronic rhinitis, and reflux.    Drug Allergies:  Allergies  Allergen Reactions  . Codeine Nausea Only    Upset stomach    Physical Exam: There were no vitals taken for this visit.   Physical Exam  Diagnostics:    Assessment and Plan: No diagnosis found.  No orders of the defined types were placed in this encounter.   There are no Patient Instructions on file for this visit.  No follow-ups on file.    Thank you for the opportunity to care for this patient.  Please do not hesitate to contact me with questions.  Thermon Leyland, FNP [facility]

## 2020-11-01 ENCOUNTER — Other Ambulatory Visit: Payer: Self-pay

## 2020-11-01 DIAGNOSIS — Z20822 Contact with and (suspected) exposure to covid-19: Secondary | ICD-10-CM

## 2020-11-04 LAB — NOVEL CORONAVIRUS, NAA: SARS-CoV-2, NAA: NOT DETECTED
